# Patient Record
Sex: Female | Born: 1990 | Race: White | Hispanic: No | Smoking: Never smoker
Health system: Southern US, Community
[De-identification: ages and names within clinical notes are randomized; demographics above are authoritative.]

## PROBLEM LIST (undated history)

## (undated) ENCOUNTER — Inpatient Hospital Stay (HOSPITAL_COMMUNITY): Payer: Self-pay

## (undated) DIAGNOSIS — E119 Type 2 diabetes mellitus without complications: Secondary | ICD-10-CM

## (undated) DIAGNOSIS — E079 Disorder of thyroid, unspecified: Secondary | ICD-10-CM

## (undated) HISTORY — PX: ANKLE SURGERY: SHX546

---

## 2015-04-24 ENCOUNTER — Encounter (HOSPITAL_BASED_OUTPATIENT_CLINIC_OR_DEPARTMENT_OTHER): Payer: Self-pay

## 2015-04-24 ENCOUNTER — Emergency Department (HOSPITAL_BASED_OUTPATIENT_CLINIC_OR_DEPARTMENT_OTHER)
Admission: EM | Admit: 2015-04-24 | Discharge: 2015-04-24 | Disposition: A | Discharge status: Home / Self Care | Payer: No Typology Code available for payment source | Attending: Physician Assistant | Admitting: Physician Assistant

## 2015-04-24 DIAGNOSIS — E119 Type 2 diabetes mellitus without complications: Secondary | ICD-10-CM | POA: Diagnosis not present

## 2015-04-24 DIAGNOSIS — E039 Hypothyroidism, unspecified: Secondary | ICD-10-CM | POA: Insufficient documentation

## 2015-04-24 DIAGNOSIS — Z79899 Other long term (current) drug therapy: Secondary | ICD-10-CM | POA: Insufficient documentation

## 2015-04-24 DIAGNOSIS — Z7984 Long term (current) use of oral hypoglycemic drugs: Secondary | ICD-10-CM | POA: Insufficient documentation

## 2015-04-24 DIAGNOSIS — L03113 Cellulitis of right upper limb: Secondary | ICD-10-CM | POA: Diagnosis not present

## 2015-04-24 DIAGNOSIS — L539 Erythematous condition, unspecified: Secondary | ICD-10-CM | POA: Diagnosis present

## 2015-04-24 HISTORY — DX: Disorder of thyroid, unspecified: E07.9

## 2015-04-24 HISTORY — DX: Morbid (severe) obesity due to excess calories: E66.01

## 2015-04-24 HISTORY — DX: Type 2 diabetes mellitus without complications: E11.9

## 2015-04-24 LAB — CBG MONITORING, ED: Glucose-Capillary: 101 mg/dL — ABNORMAL HIGH (ref 65–99)

## 2015-04-24 MED ORDER — CEPHALEXIN 500 MG PO CAPS: 500.0000 mg | ORAL_CAPSULE | Freq: Four times a day (QID) | ORAL | Status: DC

## 2015-04-24 MED ORDER — CEPHALEXIN 250 MG PO CAPS
500.0000 mg | ORAL_CAPSULE | Freq: Once | ORAL | Status: AC
  Administered 2015-04-24: 500 mg via ORAL
  Filled 2015-04-24: qty 2

## 2015-04-24 MED ORDER — ACETAMINOPHEN 325 MG PO TABS
650.0000 mg | ORAL_TABLET | Freq: Once | ORAL | Status: AC
  Administered 2015-04-24: 650 mg via ORAL
  Filled 2015-04-24: qty 2

## 2015-04-24 NOTE — ED Notes (Signed)
Patient here with right wrist redness and warmth since awakening this am. No known trauma

## 2015-04-24 NOTE — Discharge Instructions (Signed)
If you see signs of worsening infection (warmth, redness, tenderness, pus, sharp increase in pain, fever, red streaking) immediately return to the emergency department.  Do not hesitate to return to the emergency room for any new, worsening or concerning symptoms.  Do NOT use rubbing alcohol or hydrogen peroxide  Please obtain primary care using resource guide below. Let them know that you were seen in the emergency room and that they will need to obtain records for further outpatient management.      Cellulitis Cellulitis is an infection of the skin and the tissue under the skin. The infected area is usually red and tender. This happens most often in the arms and lower legs. HOME CARE   Take your antibiotic medicine as told. Finish the medicine even if you start to feel better.  Keep the infected arm or leg raised (elevated).  Put a warm cloth on the area up to 4 times per day.  Only take medicines as told by your doctor.  Keep all doctor visits as told. GET HELP IF:  You see red streaks on the skin coming from the infected area.  Your red area gets bigger or turns a dark color.  Your bone or joint under the infected area is painful after the skin heals.  Your infection comes back in the same area or different area.  You have a puffy (swollen) bump in the infected area.  You have new symptoms.  You have a fever. GET HELP RIGHT AWAY IF:   You feel very sleepy.  You throw up (vomit) or have watery poop (diarrhea).  You feel sick and have muscle aches and pains.   This information is not intended to replace advice given to you by your health care provider. Make sure you discuss any questions you have with your health care provider.   Document Released: 12/13/2007 Document Revised: 03/17/2015 Document Reviewed: 09/11/2011 Elsevier Interactive Patient Education 2016 [employer].    Emergency Department Resource Guide 1) Find a Doctor and Pay Out of Pocket Although  you won't have to find out who is covered by your insurance plan, it is a good idea to ask around and get recommendations. You will then need to call the office and see if the doctor you have chosen will accept you as a new patient and what types of options they offer for patients who are self-pay. Some doctors offer discounts or will set up payment plans for their patients who do not have insurance, but you will need to ask so you aren't surprised when you get to your appointment.  2) Contact Your Local Health Department Not all health departments have doctors that can see patients for sick visits, but many do, so it is worth a call to see if yours does. If you don't know where your local health department is, you can check in your phone book. The CDC also has a tool to help you locate your state's health department, and many state websites also have listings of all of their local health departments.  3) Find a Walk-in Clinic If your illness is not likely to be very severe or complicated, you may want to try a walk in clinic. These are popping up all over the country in pharmacies, drugstores, and shopping centers. They're usually staffed by nurse practitioners or physician assistants that have been trained to treat common illnesses and complaints. They're usually fairly quick and inexpensive. However, if you have serious medical issues or chronic medical problems,  these are probably not your best option.  No Primary Care Doctor: - Call Health Connect at  [phone] - they can help you locate a primary care doctor that  accepts your insurance, provides certain services, etc. - Physician Referral Service- [phone]  Chronic Pain Problems: Organization         Address  Phone   Notes  [facility]  [phone] Patients need to be referred by their primary care doctor.   Medication Assistance: Organization         Address  Phone   Notes  [facility] Medication [facility] [address] [place] [place], [place] [address] [phone] --Must be a resident of [facility] -- Must have NO insurance coverage whatsoever (no Medicaid/ Medicare, etc.) -- The pt. MUST have a primary care doctor that directs their care regularly and follows them in the community   [facility]  [phone]   [employer]  [phone]    Agencies that provide inexpensive medical care: Organization         Address  Phone   Notes  [facility]  [phone]   [facility]    [phone]   [facility] [address] [place], [place] [address] [phone]   [facility] [address] [place] [place] [phone]   Planned Parenthood    [phone]   [facility]    [phone]   [facility]  [address], [place] Phone:  [phone], Fax:  [phone] Hours of Operation:  9 am - 6 pm, M-F.  Also accepts Medicaid/Medicare and self-pay.  [facility]  [address],  Phone: [phone], Fax: [phone]. Hours of Operation:  8:30 am - 5:30 pm, M-F.  Also accepts Medicaid and self-pay.  [facility] [place] [address] [place] Phone: [phone]   [facility] [address] Natasha Bence [place], [place] [phone], Ext. [phone] Mondays & Thursdays: 7-9 AM.  First 15 patients are seen on a first come, first serve basis.    Medicaid-accepting [place] [facility] Providers:  Organization         Address  Phone   Notes  [facility] [address],  [phone] Also accepts self-pay patients.  [facility] [address] Laurell Josephs [place], [place]  [phone]   [facility] [address], [place] [phone]   [facility] [address] [place] [phone]   Renaye Rakers [address], [place]   [phone] Only accepts [place] Access [place] patients after they have their name applied to their card.   Self-Pay (no insurance) in [facility]:  Organization         Address  Phone   Notes  Sickle Cell Patients, [facility] [address] [place], [place] [phone]   [facility] Urgent Care [address] [place], [place] [phone]   [facility]  [address], [place] [phone]   [facility]/Dr. Osei-Bonsu  [address] [place] or [address], [place] [phone] Phone number for both [place] and [place] locations is the same.  Urgent Medical and  [facility] [address], [place] [phone]   [facility] [address] [place] or [address] [phone]   [facility] [address], [place] [phone], phone; [phone], fax Sees patients 1st and 3rd Saturday of every month.  Must not qualify for public or private insurance (i.e. Medicaid, Medicare, [place] Health Choice, Veterans' Benefits)  Household income should be no more than 200% of the poverty level The clinic cannot treat you if you are pregnant or think you are pregnant  Sexually transmitted diseases are not treated at the clinic.    Dental Care: Organization         Address  Phone  Notes  [facility] [address] [place], [place] [phone] Accepts children up to age 24 who are enrolled in [place] or [place] Health Choice; pregnant women with a Medicaid card; and children who have applied for Medicaid or [place] Health Choice, but were declined, whose parents can pay a reduced fee at time of service.  [facility]  [address], [place] [phone] Accepts  children up to age 50 who are enrolled in [place] or [place] Health Choice; pregnant women with a Medicaid card; and children who have applied for Medicaid or [place] Health Choice, but were declined, whose parents can pay a reduced fee at time of service.  [facility]  [address] [place], [place] [phone] Patients are seen by appointment only. Walk-ins are not accepted. [facility] will see patients 33 years of age and older. Monday - Tuesday (8am-5pm) Most Wednesdays (8:30-5pm) $30 per visit, cash only  [facility] Adult Dental Access PRO[facility]  [address], [facility] [phone] Patients are seen by appointment only. Walk-ins are not accepted. [facility] will see patients 22 years of age and older. One Wednesday Evening (Monthly: Volunteer Based).  $30 per visit, cash only  [employer]  [phone] for adults; Children under age 64, call Graduate Pediatric Dentistry at [phone]. Children aged 68-14, please call [phone] to request a pediatric application.  Dental services are provided in all areas of dental care including fillings, crowns and bridges, complete and partial dentures, implants, gum treatment, root canals, and extractions. Preventive care is also provided. Treatment is provided to both adults and children. Patients are selected via a lottery and there is often a waiting list.   [facility] [address], [place]  [phone] [email or web address]   [facility] [address] [place], [place] [phone], Ext. [phone] Second and Fourth Thursday of each month, opens at 6:30 AM; Clinic ends at 9 AM.  Patients are seen on a first-come first-served basis, and a limited number are seen during each clinic.   [facility]  [address] Ether Griffins [place], [place] [phone]   Eligibility Requirements You must have lived in [place], [place], or [place] counties for at least the  last three months.   You cannot be eligible for state or federal sponsored [place], including [employer], [place], or [employer].   You generally cannot be eligible for healthcare insurance through your employer.    How to apply: Eligibility screenings are held every Tuesday and Wednesday afternoon from 1:00 pm until 4:00 pm. You do not need an appointment for the interview!  [facility]  Dental Clinic [address], [place], [place] [phone]   [facility] Health Department  [phone]   [facility]  [phone]   [facility] Health Department  [phone]    Behavioral Health Resources in the Community: Intensive Outpatient Programs Organization         Address  Phone  Notes  [facility] [address] [place], [place] [phone]   [facility] Outpatient [address] [place], [place] [phone]   [facility] [address] [place], [place]  [phone]   [facility] Mental Health [address],  [place], [place] [phone] or [phone]   Substance Abuse Resources Organization         Address  Phone  Notes  [facility]  [phone]   [facility]  [phone]   [facility]  [phone]   Floydene Flock  [phone]   Residential & Outpatient Substance Abuse Program  [phone]   Psychological Services Organization         Address  Phone  Notes  [facility] Behavioral Health  [phone]   [facility]  [phone]   [facility] [address], [place] [phone] or [phone]    Mobile Crisis Teams Organization         Address  Phone  Notes  [facility], Mobile Crisis Care Unit  [phone]   [facility]  [address] [place], [place] [phone]   Doristine Locks [address] [place] [phone]     Self-Help/Support Groups Organization         Address  Phone             Notes  [facility] [place] - variety of support groups  [phone]- I7437963 Call for more information  Narcotics Anonymous (NA), [facility] [address], [employer] [place]  2 meetings at this location   Statistician         Address  Phone  Notes  [facility] [address],    [place]  [phone]   [facility]  [address] [place] [address], [place], [place] [phone]   [facility] [address] [place], [place] [phone] Admissions: 8am-3pm M-F  [facility] [address],    [place], [place] [phone]   [facility] [address] [place], [place], [place] [phone]   [facility] [address]  [place], [place] [phone]   [facility] - Intensive Outpatient [address] Laurell Joseph[address], [place], [place] [phone]   [facility] ([facility] [address] [place].,  [place], [place] [phone] or [phone]   [facility] ([facility]) [address] [place], [place] [phone] Accepts Medicaid  [facility] [address],  [place] [phone] Substance Abuse/Addiction Treatment   [facility] Organization         Address  Phone  Notes  [facility]  [phone]   Angie Fava, PhD [address] Ervin Knack [place], [place]   [phone] or [phone]   [facility] Behavioral   [address] [place], [place] [phone]   [facility] [address], [place], [place] [phone] Insurance/Medicaid/sponsorship through [employer] and Families [address]  [place], [place] [phone]) [phone] Therapy/tele-psych/case  [facility] [address]   [place], [place] [phone]) [phone]    Dr. Lolly MustacheArfeen  [phone](336) [phone]   Free  [facility] [place]  [employer] [facility] 1) [address], [place] 2) [address], [place] 3)  [address], [place] [phone](336) [phone]) [phone]  [phone](336) [phone]   [facility] Child Abuse Hotline [phone](336) [phone] or 3510992443(336) [phone] (After Hours)

## 2015-04-24 NOTE — ED Provider Notes (Signed)
CSN: [id number]     Arrival date & time 04/24/15  1136 History   First MD Initiated Contact with Patient 04/24/15 1211     Chief Complaint  Patient presents with  . Wrist Problem     (Consider location/radiation/quality/duration/timing/severity/associated sxs/prior Treatment) HPI   Blood pressure 146/95, pulse 106, temperature 98.6 F (37 C), temperature source Oral, resp. rate 20, SpO2 99 %.  Laura Brennan is a 24 y.o. female with past medical history significant for non-insulin-dependent diabetes and hypothyroidism complaining of painful erythema which she noticed upon awakening to the right wrist and left ear. There was no known trauma, patient denies pruritus, there was no other family members or household members affected by this. She denies similar prior episodes, fever, chills, nausea, vomiting.   Past Medical History  Diagnosis Date  . Thyroid disease   . Diabetes mellitus without complication (HCC)    History reviewed. No pertinent past surgical history. No family history on file. Social History  Substance Use Topics  . Smoking status: Never Smoker   . Smokeless tobacco: None  . Alcohol Use: None   OB History    No data available     Review of Systems  10 systems reviewed and found to be negative, except as noted in the HPI.   Allergies  Review of patient's allergies indicates no known allergies.  Home Medications   Prior to Admission medications   Medication Sig Start Date End Date Taking? Authorizing Provider  levothyroxine (SYNTHROID, LEVOTHROID) 150 MCG tablet Take 150 mcg by mouth daily before breakfast.   Yes Historical Provider, MD  metFORMIN (GLUCOPHAGE) 500 MG tablet Take by mouth daily with breakfast.   Yes Historical Provider, MD  cephALEXin (KEFLEX) 500 MG capsule Take 1 capsule (500 mg total) by mouth 4 (four) times daily. 04/24/15   Adler Alton, PA-C   BP 124/95 mmHg  Pulse 77  Temp(Src) 98.6 F (37 C) (Oral)  Resp 19  SpO2  100% Physical Exam  Constitutional: She is oriented to person, place, and time. She appears well-developed and well-nourished. No distress.  HENT:  Head: Normocephalic.  Eyes: Conjunctivae and EOM are normal.  Cardiovascular: Normal rate.   Pulmonary/Chest: Effort normal. No stridor.  Musculoskeletal: Normal range of motion.  Neurological: She is alert and oriented to person, place, and time.  Skin: Rash noted.  5 x 7 area of cellulitis to ulnar aspect of distal right forearm. There is no focal fluctuance, no puncture wounds. Mildly tender to palpation with no discharge.   Slight erythema to lobe of left ear  Psychiatric: She has a normal mood and affect.  Nursing note and vitals reviewed.   ED Course  Procedures (including critical care time) Labs Review Labs Reviewed  CBG MONITORING, ED - Abnormal; Notable for the following:    Glucose-Capillary 101 (*)    All other components within normal limits    Imaging Review No results found. I have personally reviewed and evaluated these images and lab results as part of my medical decision-making.   EKG Interpretation None      MDM   Final diagnoses:  Cellulitis of right upper extremity    Filed Vitals:   04/24/15 1141 04/24/15 1231  BP: 146/95 124/95  Pulse: 106 77  Temp: 98.6 F (37 C)   TempSrc: Oral   Resp: 20 19  SpO2: 99% 100%    Medications  cephALEXin (KEFLEX) capsule 500 mg (500 mg Oral Given 04/24/15 1230)  acetaminophen (TYLENOL) tablet 650 mg (  650 mg Oral Given 04/24/15 1229)    Laura Brennan is 24 y.o. female presenting with cellulitis to right wrist and left ear upon waking this morning. Patient is mildly tachycardic at triage however this is resolved on my exam. Normal blood glucose level. She is afebrile. No fluctuance. She was likely bitten overnight and has developed a cellulitis. Patient will be started on Keflex. We've had an extensive discussion of return precautions including signs of  systemic infection or worsen local infection. Patient verbalizes her understanding.   Evaluation does not show pathology that would require ongoing emergent intervention or inpatient treatment. Pt is hemodynamically stable and mentating appropriately. Discussed findings and plan with patient/guardian, who agrees with care plan. All questions answered. Return precautions discussed and outpatient follow up given.   New Prescriptions   CEPHALEXIN (KEFLEX) 500 MG CAPSULE    Take 1 capsule (500 mg total) by mouth 4 (four) times daily.         Wynetta Emery, PA-C 04/24/15 1243  Courteney Randall An, MD 04/25/15 [phone]

## 2016-04-27 ENCOUNTER — Inpatient Hospital Stay (HOSPITAL_COMMUNITY): Payer: Managed Care, Other (non HMO)

## 2016-04-27 ENCOUNTER — Inpatient Hospital Stay (HOSPITAL_COMMUNITY)
Admission: AD | Admit: 2016-04-27 | Discharge: 2016-04-28 | Disposition: A | Discharge status: Home / Self Care | Payer: Managed Care, Other (non HMO) | Source: Ambulatory Visit | Attending: Obstetrics & Gynecology | Admitting: Obstetrics & Gynecology

## 2016-04-27 ENCOUNTER — Encounter (HOSPITAL_COMMUNITY): Payer: Self-pay | Admitting: *Deleted

## 2016-04-27 DIAGNOSIS — E079 Disorder of thyroid, unspecified: Secondary | ICD-10-CM | POA: Insufficient documentation

## 2016-04-27 DIAGNOSIS — O209 Hemorrhage in early pregnancy, unspecified: Secondary | ICD-10-CM | POA: Insufficient documentation

## 2016-04-27 DIAGNOSIS — O99282 Endocrine, nutritional and metabolic diseases complicating pregnancy, second trimester: Secondary | ICD-10-CM | POA: Diagnosis not present

## 2016-04-27 DIAGNOSIS — O2342 Unspecified infection of urinary tract in pregnancy, second trimester: Secondary | ICD-10-CM | POA: Diagnosis not present

## 2016-04-27 DIAGNOSIS — O24912 Unspecified diabetes mellitus in pregnancy, second trimester: Secondary | ICD-10-CM | POA: Diagnosis present

## 2016-04-27 DIAGNOSIS — Z7984 Long term (current) use of oral hypoglycemic drugs: Secondary | ICD-10-CM | POA: Insufficient documentation

## 2016-04-27 DIAGNOSIS — Z3A14 14 weeks gestation of pregnancy: Secondary | ICD-10-CM | POA: Insufficient documentation

## 2016-04-27 DIAGNOSIS — O99212 Obesity complicating pregnancy, second trimester: Secondary | ICD-10-CM | POA: Insufficient documentation

## 2016-04-27 DIAGNOSIS — B009 Herpesviral infection, unspecified: Secondary | ICD-10-CM | POA: Insufficient documentation

## 2016-04-27 LAB — CBC
HEMATOCRIT: 42.2 % (ref 36.0–46.0)
HEMOGLOBIN: 14.1 g/dL (ref 12.0–15.0)
MCH: 28.1 pg (ref 26.0–34.0)
MCHC: 33.4 g/dL (ref 30.0–36.0)
MCV: 84.1 fL (ref 78.0–100.0)
Platelets: 304 10*3/uL (ref 150–400)
RBC: 5.02 MIL/uL (ref 3.87–5.11)
RDW: 13.2 % (ref 11.5–15.5)
WBC: 14 10*3/uL — ABNORMAL HIGH (ref 4.0–10.5)

## 2016-04-27 LAB — URINALYSIS, ROUTINE W REFLEX MICROSCOPIC
Bilirubin Urine: NEGATIVE
Glucose, UA: NEGATIVE mg/dL
KETONES UR: NEGATIVE mg/dL
LEUKOCYTES UA: NEGATIVE
NITRITE: POSITIVE — AB
PH: 6.5 (ref 5.0–8.0)
Protein, ur: NEGATIVE mg/dL
Specific Gravity, Urine: 1.02 (ref 1.005–1.030)

## 2016-04-27 LAB — URINE MICROSCOPIC-ADD ON

## 2016-04-27 LAB — POCT PREGNANCY, URINE: Preg Test, Ur: POSITIVE — AB

## 2016-04-27 NOTE — MAU Provider Note (Signed)
MAU HISTORY AND PHYSICAL  Chief Complaint:  No chief complaint on file.   Laura Brennan is a 25 y.o.  G1P0.  at 102w0d by LMP presenting for vaginal bleeding. Patient states she has been having  moderate vaginal bleeding.   Found out she was pregnant [redacted] weeks ago, feels like she has been cramping since then. Has noticed some pink on toilet paper after wiping on two occasions. Today felt like the cramping was more intense and had a period amount of bleeding. Went to bathroom after coming in and says she has passed some clots. Has been having intercourse. Has not been seen in office but has appt for Nov 1st. Has low back pain at baseline, denies other pain. Has a headache, which is common for her.   Past Medical History:  Diagnosis Date  . Diabetes mellitus without complication (HCC)   . Morbid obesity (HCC)   . Thyroid disease     Past Surgical History:  Procedure Laterality Date  . ANKLE SURGERY      History reviewed. No pertinent family history.  Social History  Substance Use Topics  . Smoking status: Never Smoker  . Smokeless tobacco: Not on file  . Alcohol use No    Allergies no known allergies  Prescriptions Prior to Admission  Medication Sig Dispense Refill Last Dose  . cephALEXin (KEFLEX) 500 MG capsule Take 1 capsule (500 mg total) by mouth 4 (four) times daily. 40 capsule 0   . levothyroxine (SYNTHROID, LEVOTHROID) 150 MCG tablet Take 150 mcg by mouth daily before breakfast.     . metFORMIN (GLUCOPHAGE) 500 MG tablet Take by mouth daily with breakfast.       Review of Systems - Negative except for what is mentioned in HPI.  Physical Exam  Pulse (P) 95, temperature (P) 98.5 F (36.9 C), temperature source (P) Oral, resp. rate (P) 20, height (P) 5\' 5"  (1.651 m), weight (!) (P) 190.5 kg (420 lb), last menstrual period 02/25/2016, SpO2 (P) 100 %. GENERAL: Well-developed, well-nourished female in no acute distress.  LUNGS: No respiratory distress HEART: Regular  rate ABDOMEN: Obese abdomen, Soft, nontender, nondistended EXTREMITIES: Nontender, no edema, 2+ distal pulses. GU: small amount of bright red blood in vaginal vault, cervix partially visualized. No cervical motion tenderness.   Labs: Results for orders placed or performed during the hospital encounter of 04/27/16 (from the past 24 hour(s))  Urinalysis, Routine w reflex microscopic (not at [facility])   Collection Time: 04/27/16 10:40 PM  Result Value Ref Range   Color, Urine YELLOW YELLOW   APPearance CLEAR CLEAR   Specific Gravity, Urine 1.020 1.005 - 1.030   pH 6.5 5.0 - 8.0   Glucose, UA NEGATIVE NEGATIVE mg/dL   Hgb urine dipstick LARGE (A) NEGATIVE   Bilirubin Urine NEGATIVE NEGATIVE   Ketones, ur NEGATIVE NEGATIVE mg/dL   Protein, ur NEGATIVE NEGATIVE mg/dL   Nitrite POSITIVE (A) NEGATIVE   Leukocytes, UA NEGATIVE NEGATIVE  Urine microscopic-add on   Collection Time: 04/27/16 10:40 PM  Result Value Ref Range   Squamous Epithelial / LPF 0-5 (A) NONE SEEN   WBC, UA 0-5 0 - 5 WBC/hpf   RBC / HPF 0-5 0 - 5 RBC/hpf   Bacteria, UA MANY (A) NONE SEEN  Pregnancy, urine POC   Collection Time: 04/27/16 10:57 PM  Result Value Ref Range   Preg Test, Ur POSITIVE (A) NEGATIVE  CBC   Collection Time: 04/27/16 11:19 PM  Result Value Ref Range   WBC  14.0 (H) 4.0 - 10.5 K/uL   RBC 5.02 3.87 - 5.11 MIL/uL   Hemoglobin 14.1 12.0 - 15.0 g/dL   HCT 69.642.2 29.536.0 - 28.446.0 %   MCV 84.1 78.0 - 100.0 fL   MCH 28.1 26.0 - 34.0 pg   MCHC 33.4 30.0 - 36.0 g/dL   RDW 13.213.2 44.011.5 - 10.215.5 %   Platelets 304 150 - 400 K/uL  hCG, quantitative, pregnancy   Collection Time: 04/27/16 11:19 PM  Result Value Ref Range   hCG, Beta Chain, Quant, S 6,103 (H) <5 mIU/mL  ABO/Rh   Collection Time: 04/27/16 11:19 PM  Result Value Ref Range   ABO/RH(D) AB POS   Wet prep, genital   Collection Time: 04/27/16 11:32 PM  Result Value Ref Range   Yeast Wet Prep HPF POC NONE SEEN NONE SEEN   Trich, Wet Prep NONE SEEN NONE  SEEN   Clue Cells Wet Prep HPF POC NONE SEEN NONE SEEN   WBC, Wet Prep HPF POC FEW (A) NONE SEEN   Sperm NONE SEEN     Imaging Studies:   [place] Ob Comp Less 14 Wks  Result Date: 04/28/2016 CLINICAL DATA:  Acute onset of vaginal bleeding.  Initial encounter. EXAM: OBSTETRIC <14 WK US AND TRANSVAGINAL OB US TECHNIQUE: Both transabdominal and transvaginal ultrasound examinations were performed for complete evaluation of the gestation as well as the maternal uterus, adnexal regions, and pelvic cul-de-sac. Transvaginal technique was performed to assess early pregnancy. COMPARISON:  None. FINDINGS: Intrauterine gestational sac: Single; visualized and normal in shape. Yolk sac:  No Embryo:  No Cardiac Activity: N/A MSD: 5.8  mm   5 w   2  d Subchorionic hemorrhage:  None visualized. Maternal uterus/adnexae: A bicornuate uterus is noted. The gestational sac is noted at the right horn. The ovaries are unremarkable in appearance. The right ovary measures 2.5 x 1.6 x 2.3 cm, while the left ovary measures 2.6 x 2.2 x 2.1 cm. No suspicious adnexal masses are seen; there is no evidence for ovarian torsion. No free fluid is seen within the pelvic cul-de-sac. IMPRESSION: Single intrauterine gestational sac noted, with a mean sac diameter of 6 mm, corresponding to a gestational age of [redacted] weeks 2 days. This does not match the gestational age by LMP, though it remains too early to determine a new estimated date of delivery. Follow-up pelvic ultrasound could be considered in 2 weeks for further evaluation. Electronically Signed   By: Roanna RaiderJeffery  Chang M.D.   On: 04/28/2016 01:21   [place] Ob Transvaginal  Result Date: 04/28/2016 CLINICAL DATA:  Acute onset of vaginal bleeding.  Initial encounter. EXAM: OBSTETRIC <14 WK US AND TRANSVAGINAL OB US TECHNIQUE: Both transabdominal and transvaginal ultrasound examinations were performed for complete evaluation of the gestation as well as the maternal uterus, adnexal regions, and pelvic  cul-de-sac. Transvaginal technique was performed to assess early pregnancy. COMPARISON:  None. FINDINGS: Intrauterine gestational sac: Single; visualized and normal in shape. Yolk sac:  No Embryo:  No Cardiac Activity: N/A MSD: 5.8  mm   5 w   2  d Subchorionic hemorrhage:  None visualized. Maternal uterus/adnexae: A bicornuate uterus is noted. The gestational sac is noted at the right horn. The ovaries are unremarkable in appearance. The right ovary measures 2.5 x 1.6 x 2.3 cm, while the left ovary measures 2.6 x 2.2 x 2.1 cm. No suspicious adnexal masses are seen; there is no evidence for ovarian torsion. No free fluid is seen within the  pelvic cul-de-sac. IMPRESSION: Single intrauterine gestational sac noted, with a mean sac diameter of 6 mm, corresponding to a gestational age of [redacted] weeks 2 days. This does not match the gestational age by LMP, though it remains too early to determine a new estimated date of delivery. Follow-up pelvic ultrasound could be considered in 2 weeks for further evaluation. Electronically Signed   By: Roanna Raider M.D.   On: 04/28/2016 01:21   Assessment: Laura Brennan is  25 y.o. G1P0  at [redacted]w[redacted]d  presents with vaginal bleeding  UTI  MDM Wet prep Gc/chlamydia [place] complete & transvaginal CBC, ABO/Rh Hcg quant  Plan:  First Trimester Bleeding- gestational sac visualized on [place] Follow up in 2 days to repeat HCG Return precautions given  UTI Rx given for 7 day course of Macrobid   Patient verbalized understanding and agreement with plan  Tillman Sers, DO PGY-1 10/20/201712:52 AM  CNM attestation:  I have seen and examined this patient; I agree with above documentation in the resident's note.   Laura Brennan is a 25 y.o. G1P0 reporting vaginal bleeding   PE: Pulse (P) 95   Temp (P) 98.5 F (36.9 C) (Oral)   Resp (P) 20   Ht (P) 5\' 5"  (1.651 m)   Wt (!) (P) 420 lb (190.5 kg)   LMP 02/25/2016   SpO2 (P) 100%   BMI (P) 69.89 kg/m  Gen: calm  comfortable, NAD Resp: normal effort, no distress Abd: gravid Pelvic: External: no lesion Vagina: small amount of blood seen  Cervix: pink, smooth, visually closed    ROS, labs, PMH reviewed   Plan: DC home Comfort measures reviewed  1st Trimester precautions  Bleeding precautions Ectopic precautions RX: none  Return to MAU as needed   Follow-up Information    [facility] [place]. Go in 2 day(s).   Why:  to repeat pregnancy hormone level Contact information: [address] [place] [phone]           Tawnya Crook, CNM 12:57 AM

## 2016-04-27 NOTE — MAU Note (Signed)
Pt reports she is pregnant and she has been spotting off/on, tonight she had a lot more bleeding and cramping.

## 2016-04-28 DIAGNOSIS — Q513 Bicornate uterus: Secondary | ICD-10-CM | POA: Insufficient documentation

## 2016-04-28 LAB — WET PREP, GENITAL
Clue Cells Wet Prep HPF POC: NONE SEEN
Sperm: NONE SEEN
Trich, Wet Prep: NONE SEEN
Yeast Wet Prep HPF POC: NONE SEEN

## 2016-04-28 LAB — ABO/RH: ABO/RH(D): AB POS

## 2016-04-28 LAB — GC/CHLAMYDIA PROBE AMP (~~LOC~~) NOT AT ARMC
Chlamydia: NEGATIVE
Neisseria Gonorrhea: NEGATIVE

## 2016-04-28 LAB — HCG, QUANTITATIVE, PREGNANCY: hCG, Beta Chain, Quant, S: 6103 m[IU]/mL — ABNORMAL HIGH

## 2016-04-28 MED ORDER — NITROFURANTOIN MONOHYD MACRO 100 MG PO CAPS: 100.0000 mg | ORAL_CAPSULE | Freq: Two times a day (BID) | ORAL | 0 refills | Status: AC

## 2016-04-28 NOTE — Discharge Instructions (Signed)

## 2016-04-30 ENCOUNTER — Inpatient Hospital Stay (HOSPITAL_COMMUNITY)
Admission: AD | Admit: 2016-04-30 | Discharge: 2016-04-30 | Disposition: A | Discharge status: Home / Self Care | Payer: Managed Care, Other (non HMO) | Source: Ambulatory Visit | Attending: Cardiology | Admitting: Cardiology

## 2016-04-30 DIAGNOSIS — O24111 Pre-existing diabetes mellitus, type 2, in pregnancy, first trimester: Secondary | ICD-10-CM | POA: Insufficient documentation

## 2016-04-30 DIAGNOSIS — Z6841 Body Mass Index (BMI) 40.0 and over, adult: Secondary | ICD-10-CM | POA: Insufficient documentation

## 2016-04-30 DIAGNOSIS — Z3A09 9 weeks gestation of pregnancy: Secondary | ICD-10-CM | POA: Insufficient documentation

## 2016-04-30 DIAGNOSIS — O99281 Endocrine, nutritional and metabolic diseases complicating pregnancy, first trimester: Secondary | ICD-10-CM | POA: Diagnosis not present

## 2016-04-30 DIAGNOSIS — E079 Disorder of thyroid, unspecified: Secondary | ICD-10-CM | POA: Diagnosis not present

## 2016-04-30 DIAGNOSIS — O4691 Antepartum hemorrhage, unspecified, first trimester: Secondary | ICD-10-CM | POA: Diagnosis not present

## 2016-04-30 DIAGNOSIS — O209 Hemorrhage in early pregnancy, unspecified: Secondary | ICD-10-CM

## 2016-04-30 DIAGNOSIS — O99211 Obesity complicating pregnancy, first trimester: Secondary | ICD-10-CM | POA: Insufficient documentation

## 2016-04-30 DIAGNOSIS — Z6790 Unspecified blood type, Rh positive: Secondary | ICD-10-CM

## 2016-04-30 DIAGNOSIS — O4690 Antepartum hemorrhage, unspecified, unspecified trimester: Secondary | ICD-10-CM

## 2016-04-30 LAB — HCG, QUANTITATIVE, PREGNANCY: HCG, BETA CHAIN, QUANT, S: 5544 m[IU]/mL — AB (ref <5)

## 2016-04-30 NOTE — MAU Note (Addendum)
Bleeding spotted this morning, still having abdominal pain which has worsened. Passed a huge clot last night.

## 2016-04-30 NOTE — Discharge Instructions (Signed)

## 2016-04-30 NOTE — MAU Provider Note (Signed)
Ms. Laura DalesJericka Brennan  is a 25 y.o. G1P0 at 2853w2d who presents to the MAU today for follow-up quant hCG after 48 hours. The patient was seen in MAU on 04/28/16 and had quant hCG of 6100 and US showed +IUGS and No YS or fetus. She endorses mild lower abdominal cramping. No vaginal bleeding since yesterday and no fever today.   OB History  Gravida Para Term Preterm AB Living  1            SAB TAB Ectopic Multiple Live Births               # Outcome Date GA Lbr Len/2nd Weight Sex Delivery Anes PTL Lv  1 Current               Past Medical History:  Diagnosis Date  . Diabetes mellitus without complication (HCC)   . Morbid obesity (HCC)   . Thyroid disease      BP 149/93 (BP Location: Left Arm)   Pulse 91   Temp 97.6 F (36.4 C) (Oral)   Resp 18   Ht 5\' 5"  (1.651 m)   Wt (!) 192.3 kg (424 lb)   LMP 02/25/2016   BMI 70.56 kg/m   CONSTITUTIONAL: Well-developed, well-nourished female in no acute distress.  ENT: External right and left ear normal.  EYES: EOM intact, conjunctivae normal.  MUSCULOSKELETAL: Normal range of motion.  CARDIOVASCULAR: Regular heart rate RESPIRATORY: Normal effort NEUROLOGICAL: Alert and oriented to person, place, and time.  SKIN: Skin is warm and dry. No rash noted. Not diaphoretic. No erythema. No pallor. PSYCH: Normal mood and affect. Normal behavior. Normal judgment and thought content.  Results for orders placed or performed during the hospital encounter of 04/30/16 (from the past 24 hour(s))  hCG, quantitative, pregnancy     Status: Abnormal   Collection Time: 04/30/16 12:22 PM  Result Value Ref Range   hCG, Beta Chain, Quant, S 5,544 (H) <5 mIU/mL    A: Falling quant hCG after 48 hours-likely failed pregnancy but cannot r/o ectopic Rh positive  P: Discharge home First trimester/ectopic precautions discussed Patient [facility]   Patient may return to MAU as needed or if her condition were to  change or worsen  Discussed with pt she likely has chronic HTN based on two recent BPs and another high BP last year and will need f/u with PCP  Donette LarryMelanie Shalin Vonbargen, CNM 04/30/2016 1:35 PM

## 2016-05-02 ENCOUNTER — Ambulatory Visit: Payer: Managed Care, Other (non HMO)

## 2016-05-02 DIAGNOSIS — O3680X0 Pregnancy with inconclusive fetal viability, not applicable or unspecified: Secondary | ICD-10-CM

## 2016-05-02 LAB — HCG, QUANTITATIVE, PREGNANCY: hCG, Beta Chain, Quant, S: 6400 m[IU]/mL — ABNORMAL HIGH (ref <5)

## 2016-05-02 NOTE — Progress Notes (Signed)
Patient presented to office today for a level check. Patient stated she is having some pain and bleeding at this time. Patient was instructed to stay for her results since she is having  pain and bleeding. I have provided patient with the phone number to contact me by 5:00 today if she has to leave for emergency. Patient verbalizes understanding at this time.  Discuss results with provider patient will need to have another quant check on Thursday 05/04/2016 since levels are not rising at a normal range. Patient u/s has  been scheduled for 05/05/2016. Patient was instructed to follow up at our office for results.

## 2016-05-05 ENCOUNTER — Ambulatory Visit (HOSPITAL_COMMUNITY)
Admission: RE | Admit: 2016-05-05 | Discharge: 2016-05-05 | Disposition: A | Discharge status: Home / Self Care | Payer: Managed Care, Other (non HMO) | Source: Ambulatory Visit | Attending: Obstetrics and Gynecology | Admitting: Obstetrics and Gynecology

## 2016-05-05 ENCOUNTER — Other Ambulatory Visit: Payer: Managed Care, Other (non HMO)

## 2016-05-05 DIAGNOSIS — Z3490 Encounter for supervision of normal pregnancy, unspecified, unspecified trimester: Secondary | ICD-10-CM | POA: Diagnosis not present

## 2016-05-05 DIAGNOSIS — O3680X0 Pregnancy with inconclusive fetal viability, not applicable or unspecified: Secondary | ICD-10-CM

## 2016-05-05 DIAGNOSIS — R938 Abnormal findings on diagnostic imaging of other specified body structures: Secondary | ICD-10-CM | POA: Diagnosis not present

## 2016-05-05 LAB — HCG, QUANTITATIVE, PREGNANCY: hCG, Beta Chain, Quant, S: 423 m[IU]/mL — ABNORMAL HIGH (ref <5)

## 2016-05-05 NOTE — Progress Notes (Signed)
Patient presented to the  office today for a repeat beta ( STAT).  Patient is complaining of pain and bleeding. She has being passing clots.  Per Anyanwu patient results are consist with miscarriage and she will need to returned to office on next Friday 05/12/2016 for a repeat beta. NOT STAT! Patient verbalizes understanding at this time.

## 2016-05-12 ENCOUNTER — Other Ambulatory Visit: Payer: Managed Care, Other (non HMO)

## 2016-05-12 DIAGNOSIS — O3680X0 Pregnancy with inconclusive fetal viability, not applicable or unspecified: Secondary | ICD-10-CM

## 2016-05-13 LAB — HCG, QUANTITATIVE, PREGNANCY: HCG, BETA CHAIN, QUANT, S: 20.2 m[IU]/mL — AB

## 2016-05-15 ENCOUNTER — Telehealth: Payer: Self-pay | Admitting: *Deleted

## 2016-05-15 NOTE — Telephone Encounter (Signed)
Ermalinda MemosJericka called this am and left a message stating she is calling about lab results from Friday 05/12/16. States no one ever called her.

## 2016-05-16 ENCOUNTER — Other Ambulatory Visit: Payer: Managed Care, Other (non HMO)

## 2016-05-17 NOTE — Telephone Encounter (Signed)
I called Ermalinda MemosJericka back and left a message for her to call us back to discuss her results and make next lab appt. Needs lab appointment for anytime 05/22/16-05/26/16 per Dr. Genevie AnnSchenk ( 10 -14 days after last lab. )

## 2016-05-19 ENCOUNTER — Other Ambulatory Visit: Payer: Managed Care, Other (non HMO)

## 2016-05-19 DIAGNOSIS — O039 Complete or unspecified spontaneous abortion without complication: Secondary | ICD-10-CM

## 2016-05-19 NOTE — Telephone Encounter (Signed)
Patient came today for repeat beta. Will call her on Monday with results.

## 2016-05-20 LAB — HCG, QUANTITATIVE, PREGNANCY: HCG, BETA CHAIN, QUANT, S: 3.1 m[IU]/mL

## 2016-05-23 NOTE — Telephone Encounter (Signed)
Patient have been given the result of lab.At this time she has declined to come back in for [facility]

## 2016-06-01 ENCOUNTER — Encounter (HOSPITAL_BASED_OUTPATIENT_CLINIC_OR_DEPARTMENT_OTHER): Payer: Self-pay | Admitting: Emergency Medicine

## 2016-06-01 ENCOUNTER — Emergency Department (HOSPITAL_BASED_OUTPATIENT_CLINIC_OR_DEPARTMENT_OTHER)
Admission: EM | Admit: 2016-06-01 | Discharge: 2016-06-01 | Disposition: A | Discharge status: Home / Self Care | Payer: No Typology Code available for payment source | Attending: Emergency Medicine | Admitting: Emergency Medicine

## 2016-06-01 ENCOUNTER — Emergency Department (HOSPITAL_BASED_OUTPATIENT_CLINIC_OR_DEPARTMENT_OTHER): Payer: No Typology Code available for payment source

## 2016-06-01 DIAGNOSIS — S2001XA Contusion of right breast, initial encounter: Secondary | ICD-10-CM | POA: Insufficient documentation

## 2016-06-01 DIAGNOSIS — E119 Type 2 diabetes mellitus without complications: Secondary | ICD-10-CM | POA: Insufficient documentation

## 2016-06-01 DIAGNOSIS — R1030 Lower abdominal pain, unspecified: Secondary | ICD-10-CM | POA: Insufficient documentation

## 2016-06-01 DIAGNOSIS — S301XXA Contusion of abdominal wall, initial encounter: Secondary | ICD-10-CM | POA: Insufficient documentation

## 2016-06-01 DIAGNOSIS — Y92410 Unspecified street and highway as the place of occurrence of the external cause: Secondary | ICD-10-CM | POA: Diagnosis not present

## 2016-06-01 DIAGNOSIS — Y9389 Activity, other specified: Secondary | ICD-10-CM | POA: Insufficient documentation

## 2016-06-01 DIAGNOSIS — S7002XA Contusion of left hip, initial encounter: Secondary | ICD-10-CM | POA: Insufficient documentation

## 2016-06-01 DIAGNOSIS — Y999 Unspecified external cause status: Secondary | ICD-10-CM | POA: Diagnosis not present

## 2016-06-01 DIAGNOSIS — R079 Chest pain, unspecified: Secondary | ICD-10-CM | POA: Insufficient documentation

## 2016-06-01 DIAGNOSIS — S0990XA Unspecified injury of head, initial encounter: Secondary | ICD-10-CM | POA: Diagnosis present

## 2016-06-01 DIAGNOSIS — T07XXXA Unspecified multiple injuries, initial encounter: Secondary | ICD-10-CM

## 2016-06-01 LAB — COMPREHENSIVE METABOLIC PANEL
ALK PHOS: 68 U/L (ref 38–126)
ALT: 40 U/L (ref 14–54)
ANION GAP: 8 (ref 5–15)
AST: 29 U/L (ref 15–41)
Albumin: 3.8 g/dL (ref 3.5–5.0)
BILIRUBIN TOTAL: 0.8 mg/dL (ref 0.3–1.2)
BUN: 15 mg/dL (ref 6–20)
CALCIUM: 8.7 mg/dL — AB (ref 8.9–10.3)
CO2: 25 mmol/L (ref 22–32)
Chloride: 105 mmol/L (ref 101–111)
Creatinine, Ser: 0.94 mg/dL (ref 0.44–1.00)
GLUCOSE: 124 mg/dL — AB (ref 65–99)
Potassium: 3.5 mmol/L (ref 3.5–5.1)
Sodium: 138 mmol/L (ref 135–145)
TOTAL PROTEIN: 7.2 g/dL (ref 6.5–8.1)

## 2016-06-01 LAB — URINALYSIS, ROUTINE W REFLEX MICROSCOPIC
Bilirubin Urine: NEGATIVE
GLUCOSE, UA: NEGATIVE mg/dL
HGB URINE DIPSTICK: NEGATIVE
Ketones, ur: NEGATIVE mg/dL
LEUKOCYTES UA: NEGATIVE
Nitrite: NEGATIVE
PH: 6 (ref 5.0–8.0)
Protein, ur: NEGATIVE mg/dL
Specific Gravity, Urine: 1.015 (ref 1.005–1.030)

## 2016-06-01 LAB — PREGNANCY, URINE: Preg Test, Ur: NEGATIVE

## 2016-06-01 LAB — CBC WITH DIFFERENTIAL/PLATELET
Basophils Absolute: 0 10*3/uL (ref 0.0–0.1)
Basophils Relative: 0 %
Eosinophils Absolute: 0.1 10*3/uL (ref 0.0–0.7)
Eosinophils Relative: 1 %
HEMATOCRIT: 39.6 % (ref 36.0–46.0)
HEMOGLOBIN: 13 g/dL (ref 12.0–15.0)
LYMPHS ABS: 2.1 10*3/uL (ref 0.7–4.0)
Lymphocytes Relative: 16 %
MCH: 27.8 pg (ref 26.0–34.0)
MCHC: 32.8 g/dL (ref 30.0–36.0)
MCV: 84.6 fL (ref 78.0–100.0)
MONO ABS: 0.9 10*3/uL (ref 0.1–1.0)
MONOS PCT: 6 %
NEUTROS ABS: 10.4 10*3/uL — AB (ref 1.7–7.7)
NEUTROS PCT: 77 %
Platelets: 279 10*3/uL (ref 150–400)
RBC: 4.68 MIL/uL (ref 3.87–5.11)
RDW: 13.1 % (ref 11.5–15.5)
WBC: 13.5 10*3/uL — ABNORMAL HIGH (ref 4.0–10.5)

## 2016-06-01 MED ORDER — KETOROLAC TROMETHAMINE 30 MG/ML IJ SOLN
30.0000 mg | Freq: Once | INTRAMUSCULAR | Status: AC
  Administered 2016-06-01: 30 mg via INTRAVENOUS
  Filled 2016-06-01: qty 1

## 2016-06-01 MED ORDER — METHOCARBAMOL 500 MG PO TABS: 500.0000 mg | ORAL_TABLET | Freq: Two times a day (BID) | ORAL | 0 refills | Status: AC

## 2016-06-01 MED ORDER — NAPROXEN 375 MG PO TABS: 375.0000 mg | ORAL_TABLET | Freq: Two times a day (BID) | ORAL | 0 refills | Status: AC

## 2016-06-01 MED ORDER — METHOCARBAMOL 500 MG PO TABS
1000.0000 mg | ORAL_TABLET | Freq: Once | ORAL | Status: AC
  Administered 2016-06-01: 1000 mg via ORAL
  Filled 2016-06-01: qty 2

## 2016-06-01 MED ORDER — IOPAMIDOL (ISOVUE-300) INJECTION 61%
100.0000 mL | Freq: Once | INTRAVENOUS | Status: AC | PRN
  Administered 2016-06-01: 100 mL via INTRAVENOUS
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
## 2016-06-28 ENCOUNTER — Encounter (HOSPITAL_BASED_OUTPATIENT_CLINIC_OR_DEPARTMENT_OTHER): Payer: Self-pay | Admitting: *Deleted

## 2016-06-28 ENCOUNTER — Emergency Department (HOSPITAL_BASED_OUTPATIENT_CLINIC_OR_DEPARTMENT_OTHER)
Admission: EM | Admit: 2016-06-28 | Discharge: 2016-06-28 | Disposition: A | Discharge status: Home / Self Care | Payer: Managed Care, Other (non HMO) | Attending: Emergency Medicine | Admitting: Emergency Medicine

## 2016-06-28 ENCOUNTER — Emergency Department (HOSPITAL_BASED_OUTPATIENT_CLINIC_OR_DEPARTMENT_OTHER): Payer: Managed Care, Other (non HMO)

## 2016-06-28 DIAGNOSIS — E119 Type 2 diabetes mellitus without complications: Secondary | ICD-10-CM | POA: Diagnosis not present

## 2016-06-28 DIAGNOSIS — Y999 Unspecified external cause status: Secondary | ICD-10-CM | POA: Diagnosis not present

## 2016-06-28 DIAGNOSIS — Z79899 Other long term (current) drug therapy: Secondary | ICD-10-CM | POA: Diagnosis not present

## 2016-06-28 DIAGNOSIS — Y929 Unspecified place or not applicable: Secondary | ICD-10-CM | POA: Insufficient documentation

## 2016-06-28 DIAGNOSIS — Y9389 Activity, other specified: Secondary | ICD-10-CM | POA: Diagnosis not present

## 2016-06-28 DIAGNOSIS — S8392XA Sprain of unspecified site of left knee, initial encounter: Secondary | ICD-10-CM | POA: Diagnosis not present

## 2016-06-28 DIAGNOSIS — X501XXA Overexertion from prolonged static or awkward postures, initial encounter: Secondary | ICD-10-CM | POA: Insufficient documentation

## 2016-06-28 DIAGNOSIS — S8992XA Unspecified injury of left lower leg, initial encounter: Secondary | ICD-10-CM | POA: Diagnosis present

## 2016-06-28 MED ORDER — IBUPROFEN 800 MG PO TABS
800.0000 mg | ORAL_TABLET | Freq: Once | ORAL | Status: AC
Start: 2016-06-28 — End: 2016-06-28
  Administered 2016-06-28: 800 mg via ORAL
  Filled 2016-06-28: qty 1
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
## 2018-03-06 IMAGING — US US OB COMP LESS 14 WK
1 series · 14 of 28 positions shown · non-contrast
Comparison: None.

CLINICAL DATA: Acute onset of vaginal bleeding.  Initial encounter.

EXAM:
OBSTETRIC <14 WK US AND TRANSVAGINAL OB US
TECHNIQUE: Both transabdominal and transvaginal ultrasound examinations were
performed for complete evaluation of the gestation as well as the
maternal uterus, adnexal regions, and pelvic cul-de-sac.
Transvaginal technique was performed to assess early pregnancy.

[Series 1: us ob comp less 14 wk · 54 acquisitions, 14 frames shown]
[im 2/54]
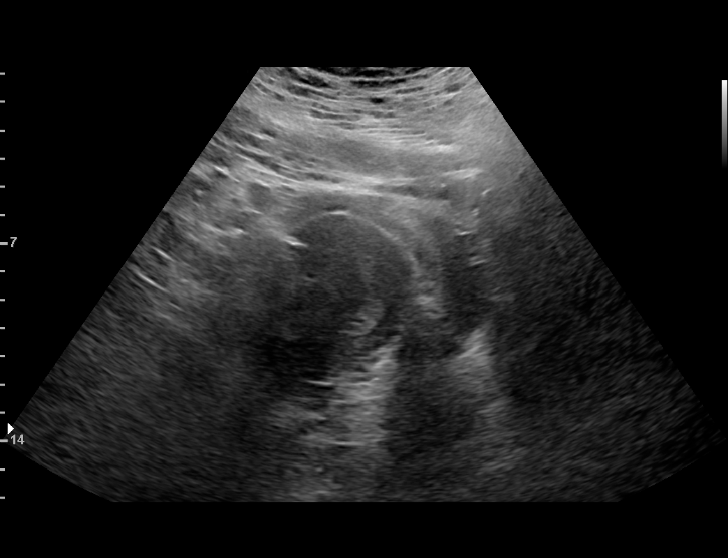
[im 6/54]
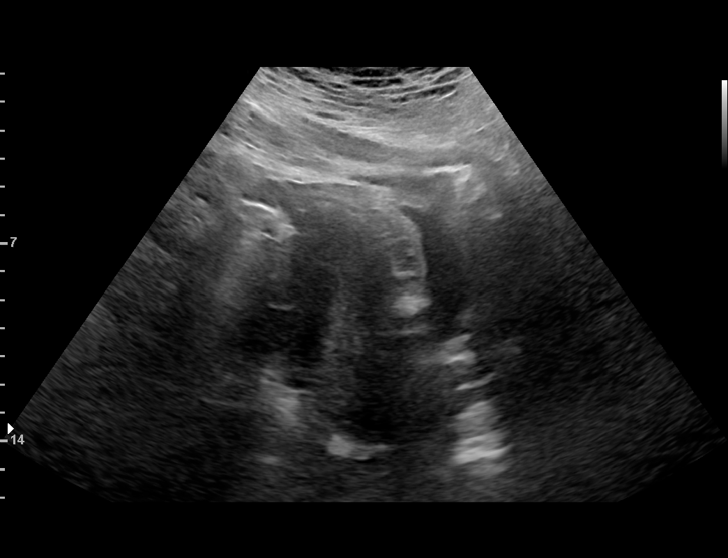
[im 10/54]
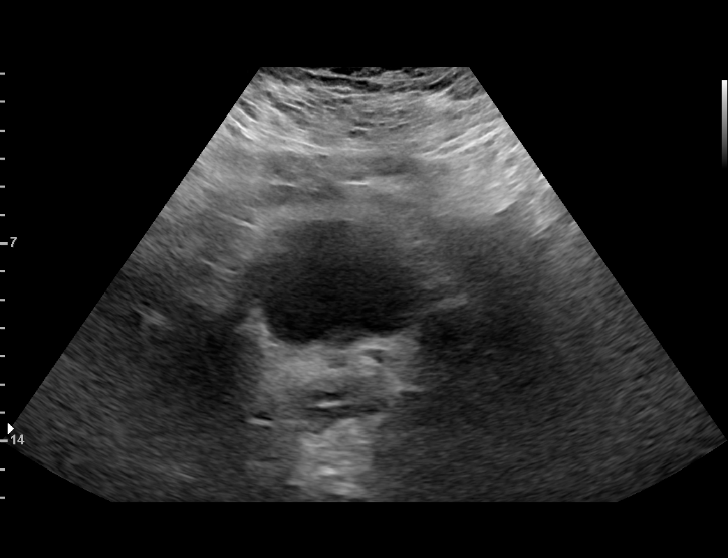
[im 14/54]
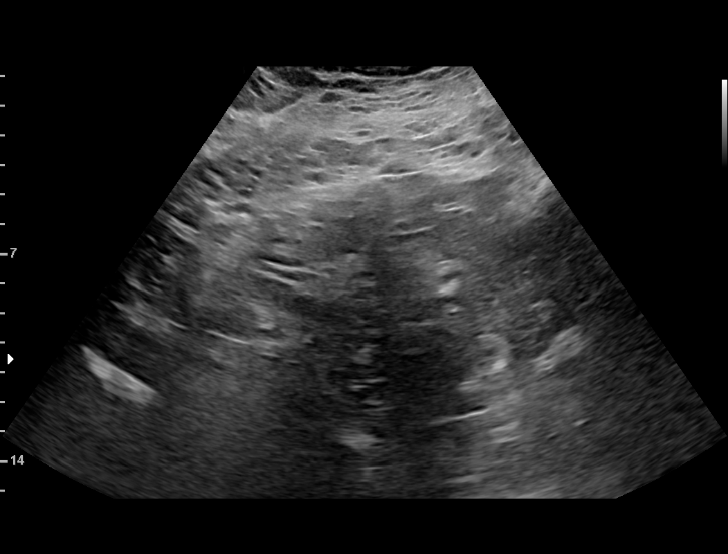
[im 18/54]
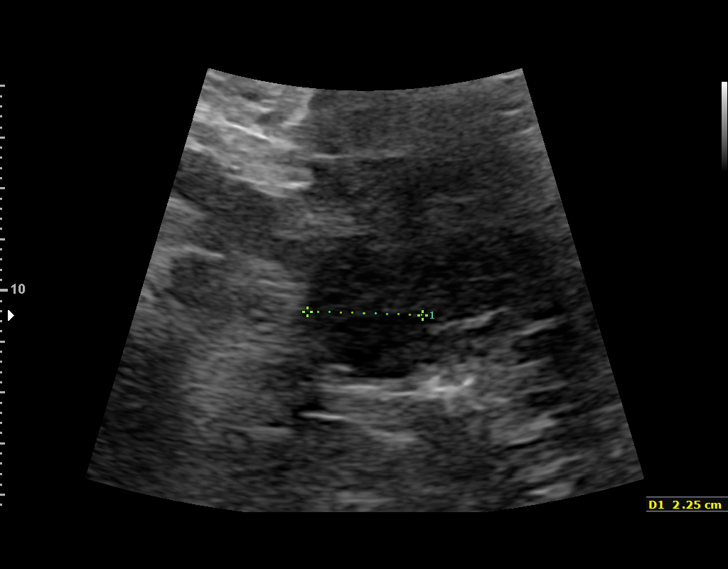
[im 22/54]
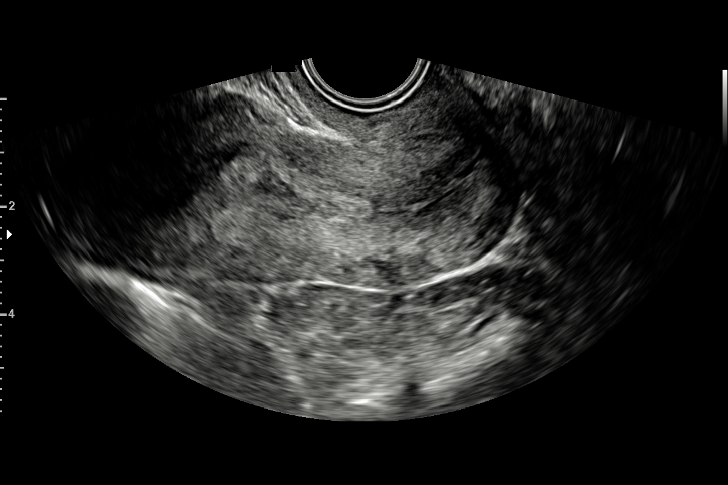
[im 26/54]
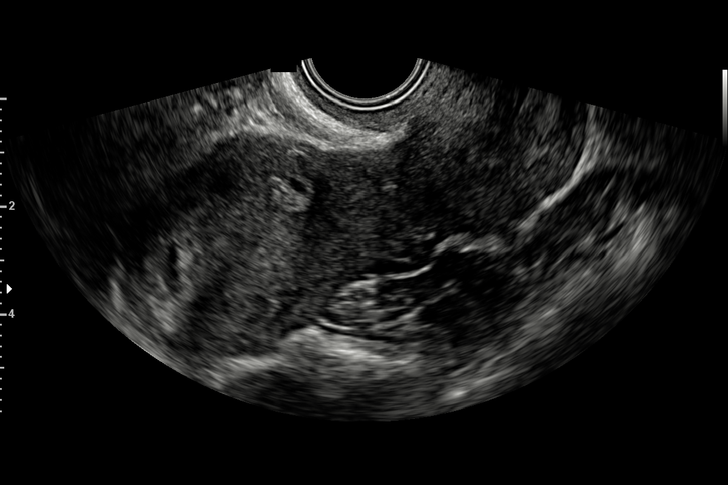
[im 30/54]
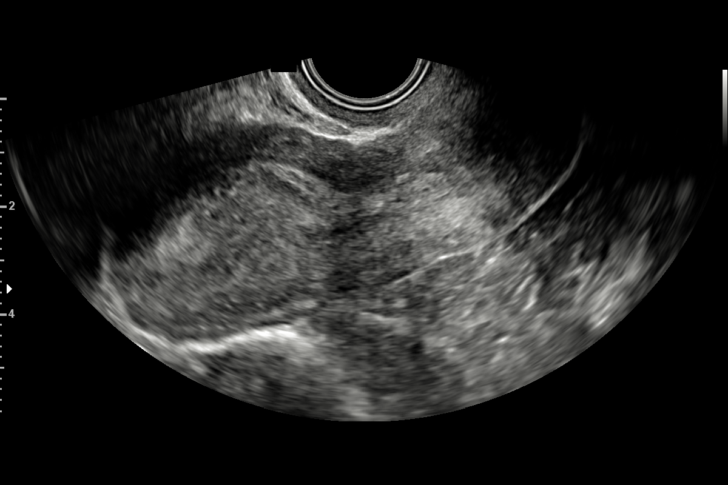
[im 34/54]
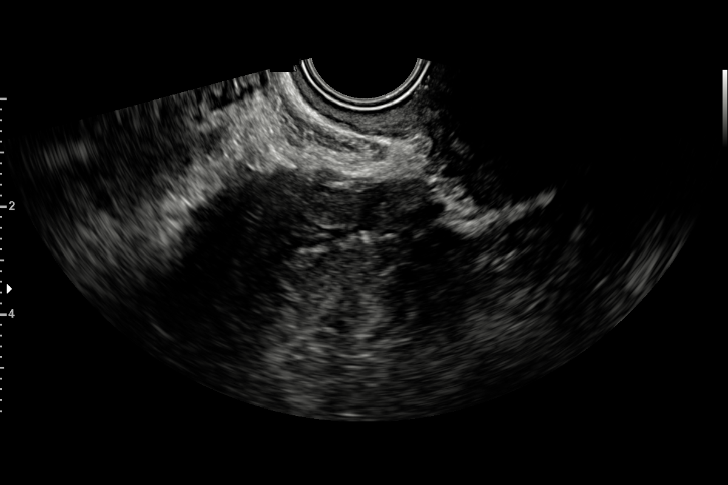
[im 38/54]
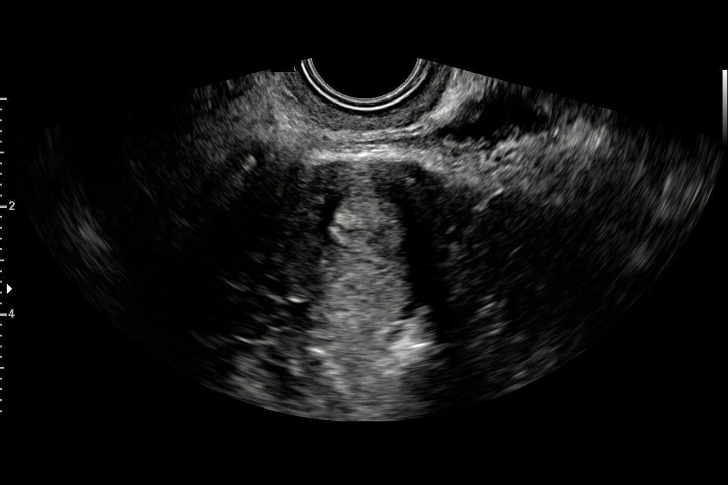
[im 42/54]
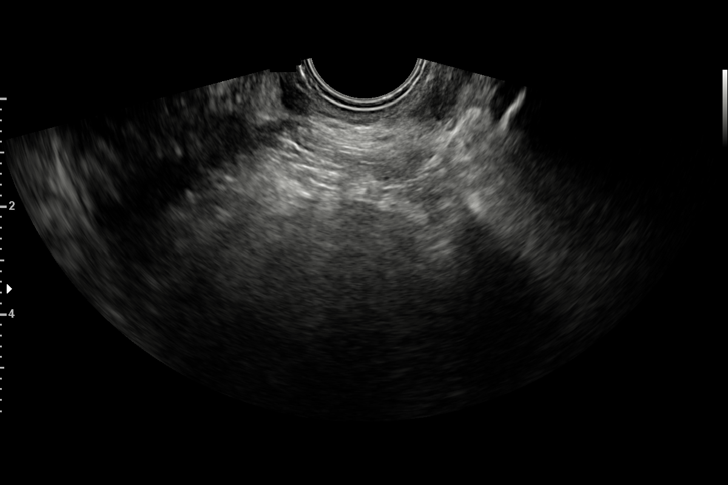
[im 46/54]
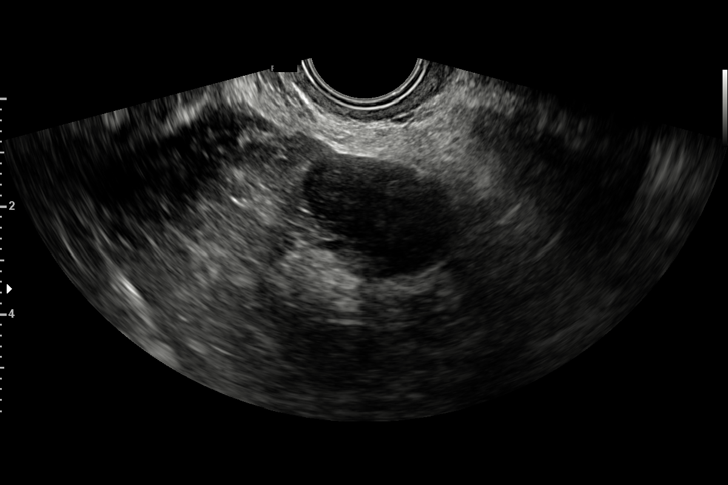
[im 50/54]
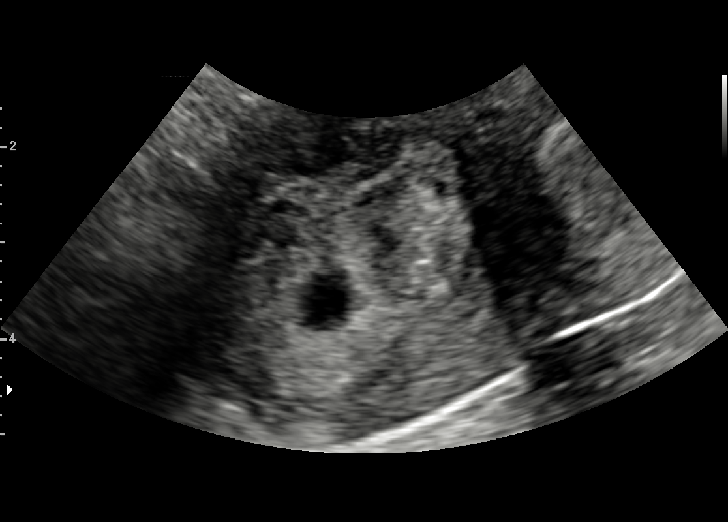
[im 54/54]
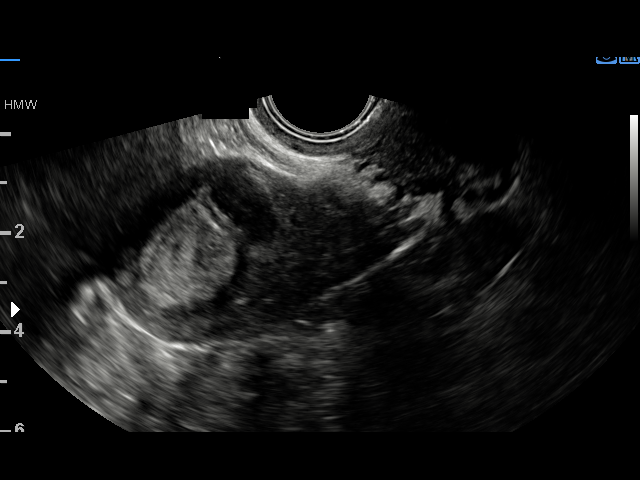

[14 of 28 positions shown; findings below may reference images not displayed]

FINDINGS: Intrauterine gestational sac: Single; visualized and normal in
shape.

Yolk sac:  No

Embryo:  No

Cardiac Activity: N/A

MSD: 5.8  mm   5 w   2  d

Subchorionic hemorrhage:  None visualized.

Maternal uterus/adnexae: A bicornuate uterus is noted. The
gestational sac is noted at the right horn.

The ovaries are unremarkable in appearance. The right ovary measures
2.5 x 1.6 x 2.3 cm, while the left ovary measures 2.6 x 2.2 x
cm. No suspicious adnexal masses are seen; there is no evidence for
ovarian torsion.

No free fluid is seen within the pelvic cul-de-sac.
IMPRESSION: Single intrauterine gestational sac noted, with a mean sac diameter
of 6 mm, corresponding to a gestational age of 5 weeks 2 days. This
does not match the gestational age by LMP, though it remains too
early to determine a new estimated date of delivery. Follow-up
pelvic ultrasound could be considered in 2 weeks for further
evaluation.

## 2018-03-13 IMAGING — US US OB TRANSVAGINAL
1 series · 15 of 28 positions shown · non-contrast
Comparison: 04/28/2016

CLINICAL DATA: Pregnancy of unknown anatomic location. Quantitative
beta HCG 04/27/2016 was [DATE].Quantitative beta HCG 04/30/2016 was
[DATE].Quantitative beta HCG 05/02/2016 was [DATE].

EXAM:
OBSTETRIC <14 WK US AND TRANSVAGINAL OB US
TECHNIQUE: Both transabdominal and transvaginal ultrasound examinations were
performed for complete evaluation of the gestation as well as the
maternal uterus, adnexal regions, and pelvic cul-de-sac.
Transvaginal technique was performed to assess early pregnancy.

[Series 1: us ob transvaginal · 15 of 39 slices shown]
[im 1/39]
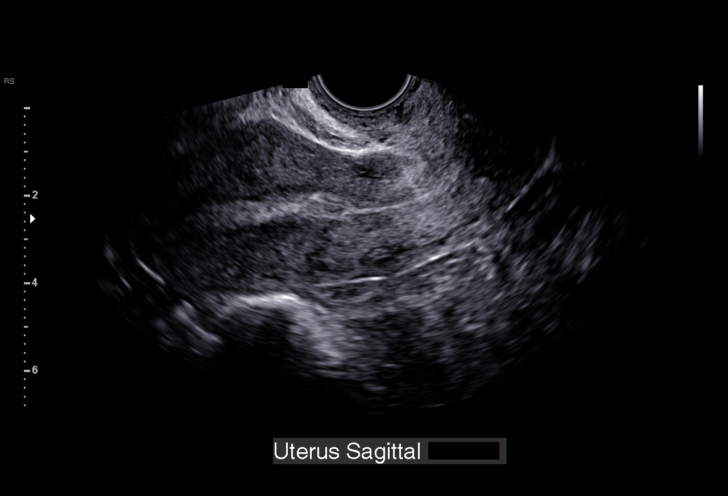
[im 3/39]
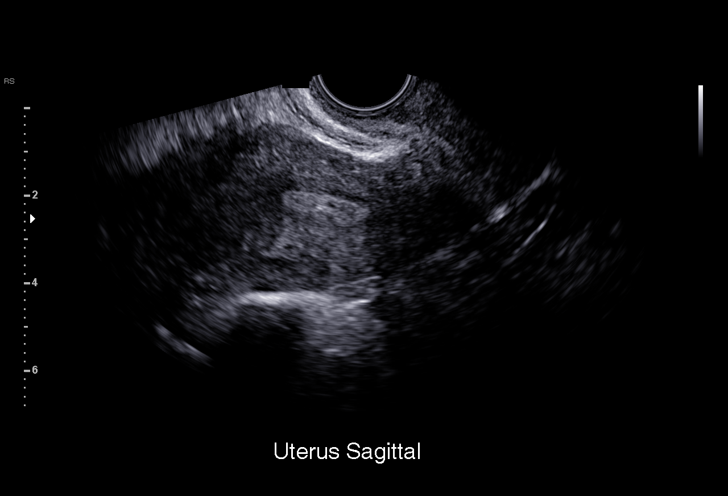
[im 6/39]
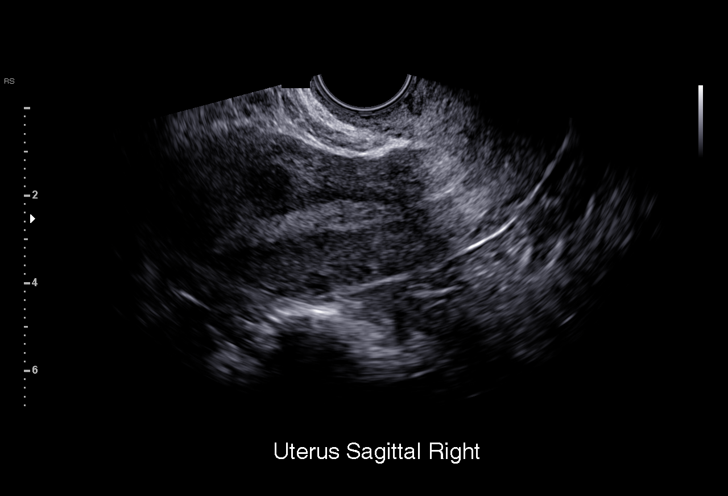
[im 9/39]
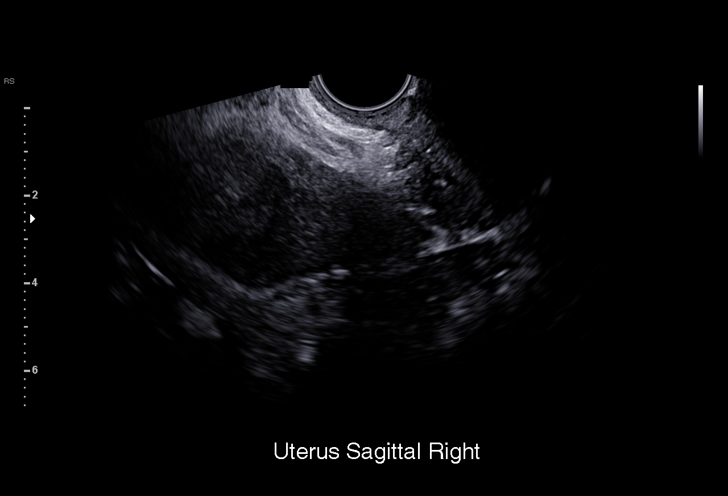
[im 12/39]
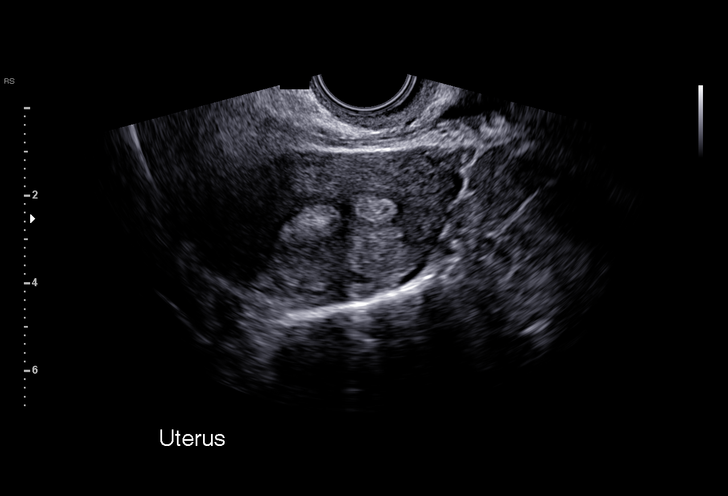
[im 15/39]
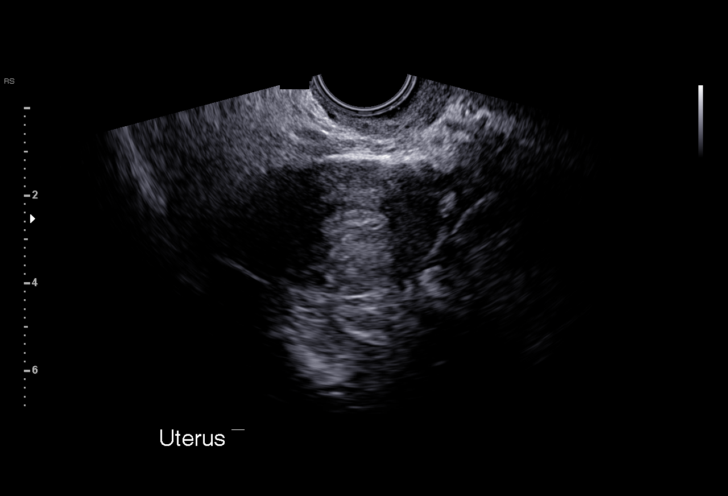
[im 17/39]
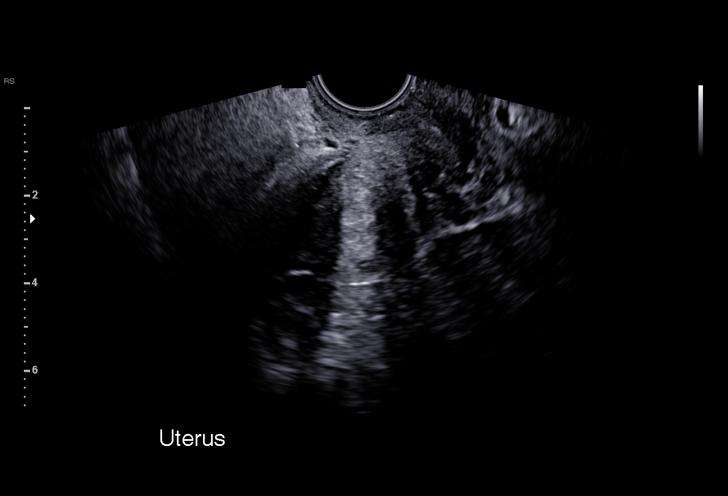
[im 20/39]
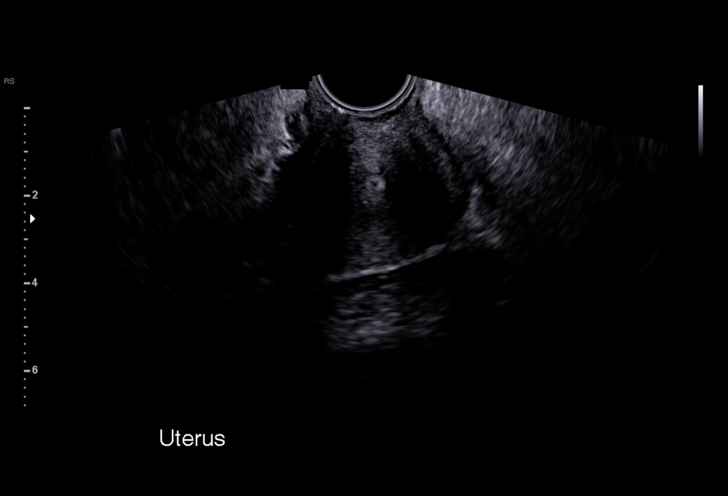
[im 22/39]
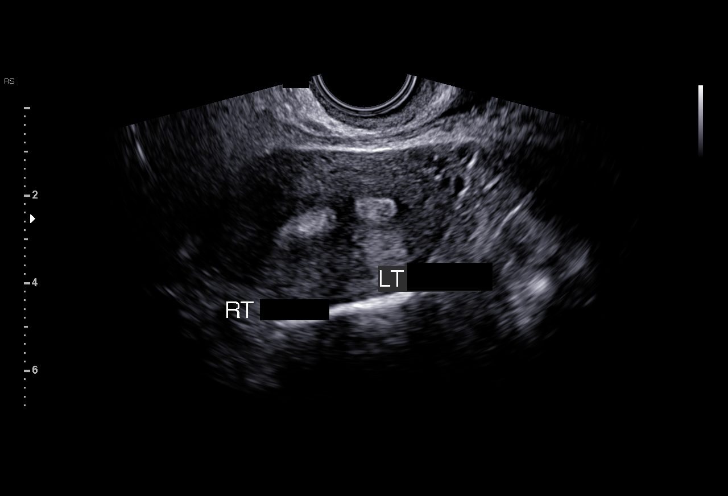
[im 24/39]
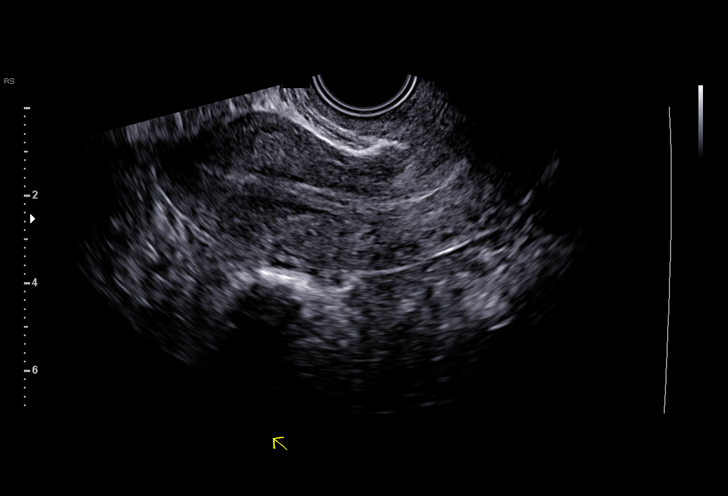
[im 27/39]
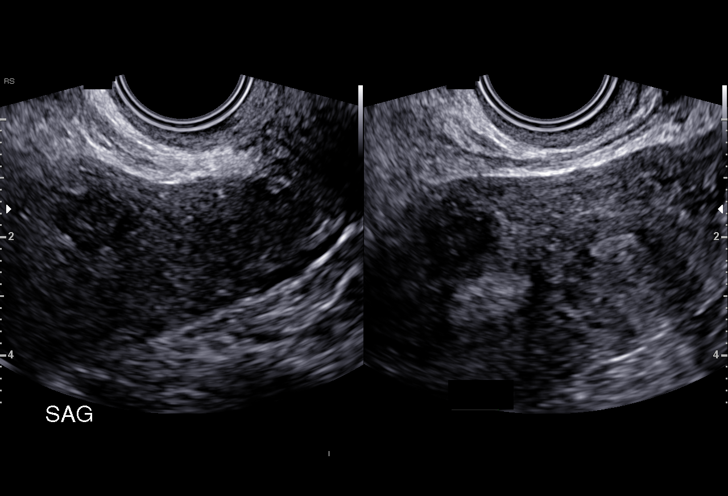
[im 30/39]
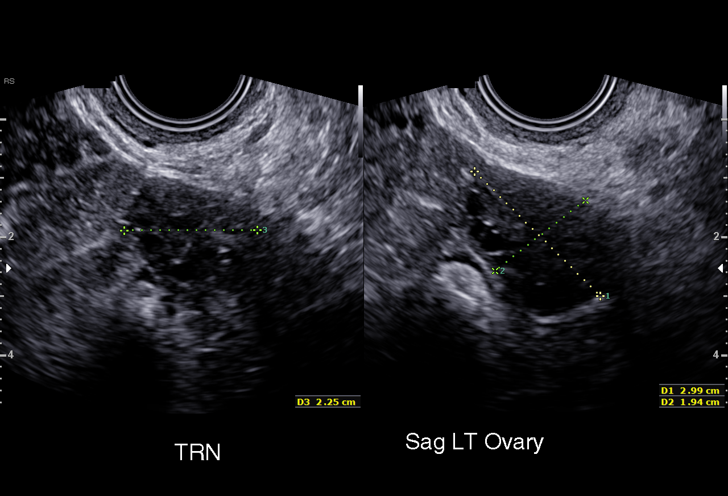
[im 33/39]
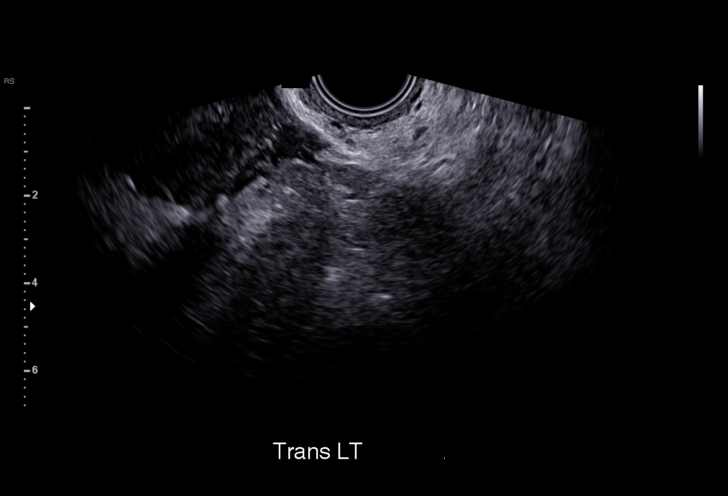
[im 36/39]
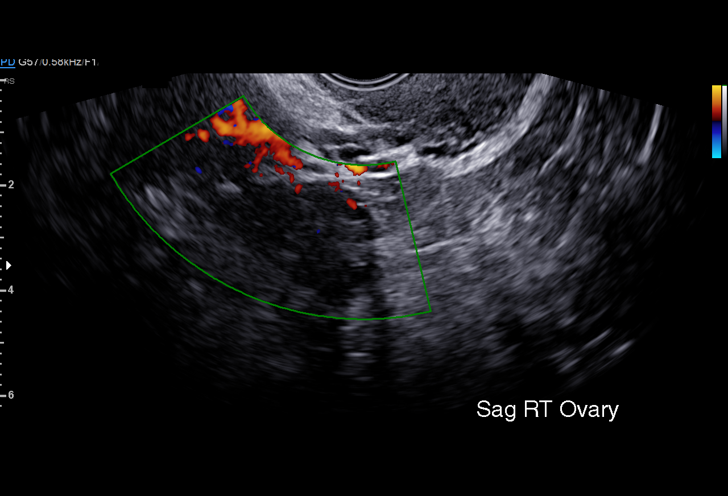
[im 39/39]
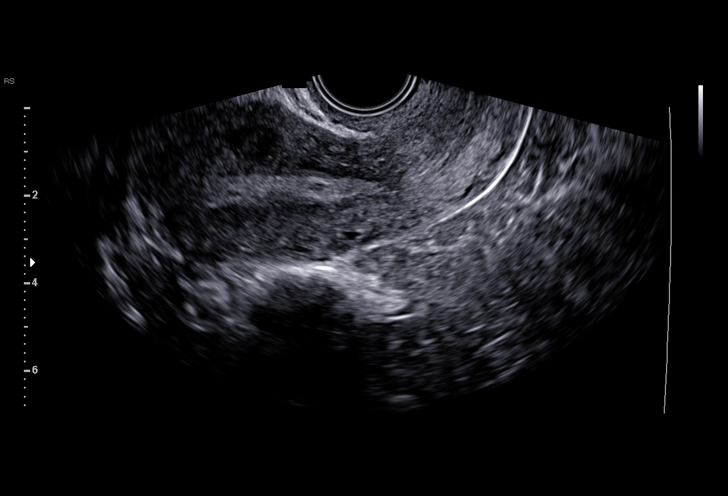

[15 of 28 positions shown; findings below may reference images not displayed]

FINDINGS: Intrauterine gestational sac: None

Yolk sac:  None

Embryo:  None

Cardiac Activity: None

Heart Rate: 9  bpm

Subchorionic hemorrhage:  None visualized.

Maternal uterus/adnexae: Bicornuate uterus noted. No adnexal mass.
Mobile echogenic debris identified within the endometrium of the
right uterine horn. Patient was bleeding at the time of the exam. No
free pelvic fluid.
IMPRESSION: 1. No intrauterine gestational sac identified. Gestational sac was
identified on the prior study in the findings are consistent with
interval spontaneous abortion.
2. No adnexal mass identified.
